# Patient Record
Sex: Female | Born: 1995 | Race: Black or African American | Hispanic: No | Marital: Single | Smoking: Former smoker
Health system: Southern US, Community
[De-identification: ages and names within clinical notes are randomized; demographics above are authoritative.]

## PROBLEM LIST (undated history)

## (undated) DIAGNOSIS — E611 Iron deficiency: Secondary | ICD-10-CM

---

## 2021-04-03 ENCOUNTER — Encounter (HOSPITAL_COMMUNITY): Payer: Self-pay | Admitting: Emergency Medicine

## 2021-04-03 ENCOUNTER — Emergency Department (HOSPITAL_COMMUNITY)
Admission: EM | Admit: 2021-04-03 | Discharge: 2021-04-03 | Disposition: A | Discharge status: Home / Self Care | Payer: Self-pay | Attending: Emergency Medicine | Admitting: Emergency Medicine

## 2021-04-03 ENCOUNTER — Emergency Department (HOSPITAL_COMMUNITY): Payer: Self-pay

## 2021-04-03 DIAGNOSIS — F17210 Nicotine dependence, cigarettes, uncomplicated: Secondary | ICD-10-CM | POA: Insufficient documentation

## 2021-04-03 DIAGNOSIS — Z23 Encounter for immunization: Secondary | ICD-10-CM | POA: Insufficient documentation

## 2021-04-03 DIAGNOSIS — S0990XA Unspecified injury of head, initial encounter: Secondary | ICD-10-CM

## 2021-04-03 DIAGNOSIS — W208XXA Other cause of strike by thrown, projected or falling object, initial encounter: Secondary | ICD-10-CM | POA: Insufficient documentation

## 2021-04-03 DIAGNOSIS — S0181XA Laceration without foreign body of other part of head, initial encounter: Secondary | ICD-10-CM | POA: Insufficient documentation

## 2021-04-03 MED ORDER — POTASSIUM CHLORIDE 10 MEQ/100ML IV SOLN: 10.0000 meq | Freq: Once | INTRAVENOUS | Status: DC

## 2021-04-03 MED ORDER — POTASSIUM CHLORIDE 10 MEQ/100ML IV SOLN: 10.0000 meq | INTRAVENOUS | Status: DC

## 2021-04-03 MED ORDER — POTASSIUM CHLORIDE CRYS ER 20 MEQ PO TBCR: 40.0000 meq | EXTENDED_RELEASE_TABLET | Freq: Once | ORAL | Status: DC

## 2021-04-03 MED ORDER — TETANUS-DIPHTH-ACELL PERTUSSIS 5-2.5-18.5 LF-MCG/0.5 IM SUSY
0.5000 mL | PREFILLED_SYRINGE | Freq: Once | INTRAMUSCULAR | Status: AC
  Administered 2021-04-03: 0.5 mL via INTRAMUSCULAR
  Filled 2021-04-03: qty 0.5

## 2021-04-03 MED ORDER — MAGNESIUM SULFATE 50 % IJ SOLN: 1.0000 g | Freq: Once | INTRAMUSCULAR | Status: DC

## 2021-04-03 MED ORDER — KETOROLAC TROMETHAMINE 30 MG/ML IJ SOLN
30.0000 mg | Freq: Once | INTRAMUSCULAR | Status: AC
  Administered 2021-04-03: 30 mg via INTRAMUSCULAR
  Filled 2021-04-03: qty 1

## 2021-04-03 MED ORDER — KETOROLAC TROMETHAMINE 30 MG/ML IJ SOLN: 30.0000 mg | Freq: Once | INTRAMUSCULAR | Status: DC
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
## 2021-04-03 NOTE — Discharge Instructions (Addendum)
You were seen today for head injury.  We scanned her head and there were no signs of a bleed or of glass inside the skull.  Keep the wound clean by using soap and water.  Avoid any hydrogen peroxide.  If you develop any severe headache, vomiting, loss of consciousness, or confusion please return back to the emergency department for further evaluation.  We also gave you an update your tetanus shots.  This should be good for the next 10 years.
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
## 2023-04-02 ENCOUNTER — Other Ambulatory Visit: Payer: Self-pay | Admitting: Obstetrics and Gynecology

## 2023-04-02 DIAGNOSIS — N6310 Unspecified lump in the right breast, unspecified quadrant: Secondary | ICD-10-CM

## 2024-10-22 ENCOUNTER — Ambulatory Visit
Admission: EM | Admit: 2024-10-22 | Discharge: 2024-10-22 | Disposition: A | Discharge status: Home / Self Care | Attending: Family Medicine | Admitting: Family Medicine

## 2024-10-22 ENCOUNTER — Encounter (HOSPITAL_COMMUNITY): Payer: Self-pay | Admitting: Emergency Medicine

## 2024-10-22 DIAGNOSIS — R070 Pain in throat: Secondary | ICD-10-CM | POA: Diagnosis present

## 2024-10-22 DIAGNOSIS — J988 Other specified respiratory disorders: Secondary | ICD-10-CM | POA: Diagnosis present

## 2024-10-22 DIAGNOSIS — B9789 Other viral agents as the cause of diseases classified elsewhere: Secondary | ICD-10-CM | POA: Diagnosis present

## 2024-10-22 HISTORY — DX: Iron deficiency: E61.1

## 2024-10-22 LAB — POCT RAPID STREP A (OFFICE): Rapid Strep A Screen: NEGATIVE

## 2024-10-22 MED ORDER — PSEUDOEPHEDRINE HCL 30 MG PO TABS: 30.0000 mg | ORAL_TABLET | Freq: Three times a day (TID) | ORAL | 0 refills | Status: AC | PRN

## 2024-10-22 MED ORDER — IBUPROFEN 600 MG PO TABS: 600.0000 mg | ORAL_TABLET | Freq: Four times a day (QID) | ORAL | 0 refills | Status: AC | PRN

## 2024-10-22 MED ORDER — PROMETHAZINE-DM 6.25-15 MG/5ML PO SYRP: 5.0000 mL | ORAL_SOLUTION | Freq: Three times a day (TID) | ORAL | 0 refills | Status: AC | PRN

## 2024-10-22 MED ORDER — CETIRIZINE HCL 10 MG PO TABS: 10.0000 mg | ORAL_TABLET | Freq: Every day | ORAL | 0 refills | Status: AC

## 2024-10-22 NOTE — ED Triage Notes (Signed)
 Pt c/o sore throat and possible fever day 3-taking mucinex-NAD-steady gait

## 2024-10-22 NOTE — Discharge Instructions (Signed)
 We will manage this as a viral respiratory infection/illness. For sore throat or cough try using a honey-based tea. Use 3 teaspoons of honey with juice squeezed from half lemon. Place shaved pieces of ginger into 1/2-1 cup of water and warm over stove top. Then mix the ingredients and repeat every 4 hours as needed. Please take ibuprofen  600mg  every 6 hours with food alternating with OR taken together with Tylenol 500mg -650mg  every 6 hours for throat pain, fevers, aches and pains. Hydrate very well with at least 2 liters of water. Eat light meals such as soups (chicken and noodles, vegetable, chicken and wild rice).  Do not eat foods that you are allergic to.  Taking an antihistamine like Zyrtec (10mg  daily) can help against postnasal drainage, sinus congestion which can cause sinus pain, sinus headaches, throat pain, painful swallowing, coughing.  You can take this together with pseudoephedrine (Sudafed) at a dose of 30 mg 3 times a day or twice daily as needed for the same kind of nasal drip, congestion.

## 2024-10-22 NOTE — ED Provider Notes (Signed)
 " [facility]  Note:  This document was prepared using Conservation officer, historic buildings and may include unintentional dictation errors.  MRN: [id number] DOB: December 03, 1995  Subjective:   Capitola Resper is a 29 y.o. female presenting for 3 day history of throat pain, painful swallowing, subjective fever, dry cough that can elicit chest pain. No runny nose, congestion, ear pain, shob, wheezing, body aches. Tried Mucinex. No asthma. No smoking of any kind including cigarettes, cigars, vaping, marijuana use.    No current outpatient medications  Allergies[1]  Past Medical History:  Diagnosis Date   Low iron    per pt     History reviewed. No pertinent surgical history.  No family history on file.  Social History   Occupational History   Not on file  Tobacco Use   Smoking status: Former    Types: Cigarettes   Smokeless tobacco: Never  Vaping Use   Vaping status: Never Used  Substance and Sexual Activity   Alcohol use: Not Currently   Drug use: Not Currently   Sexual activity: Yes    Birth control/protection: None     ROS   Objective:   Vitals: BP 119/81 (BP Location: Right Arm)   Pulse 62   Temp 98.6 F (37 C) (Oral)   Resp 16   LMP 10/08/2024   SpO2 98%   Physical Exam Constitutional:      General: She is not in acute distress.    Appearance: Normal appearance. She is well-developed. She is not ill-appearing, toxic-appearing or diaphoretic.  HENT:     Head: Normocephalic and atraumatic.     Right Ear: External ear normal.     Left Ear: External ear normal.     Nose: Nose normal.     Mouth/Throat:     Mouth: Mucous membranes are moist.     Pharynx: No pharyngeal swelling, oropharyngeal exudate, posterior oropharyngeal erythema or uvula swelling.     Tonsils: No tonsillar exudate or tonsillar abscesses. 0 on the right. 0 on the left.  Eyes:     General: No scleral icterus.       Right eye: No discharge.        Left eye: No  discharge.     Extraocular Movements: Extraocular movements intact.  Cardiovascular:     Rate and Rhythm: Normal rate and regular rhythm.     Heart sounds: Normal heart sounds. No murmur heard.    No friction rub. No gallop.  Pulmonary:     Effort: Pulmonary effort is normal. No respiratory distress.     Breath sounds: No stridor. No wheezing, rhonchi or rales.  Chest:     Chest wall: No tenderness.  Skin:    General: Skin is warm and dry.  Neurological:     General: No focal deficit present.     Mental Status: She is alert and oriented to person, place, and time.  Psychiatric:        Mood and Affect: Mood normal.        Behavior: Behavior normal.     Results for orders placed or performed during the hospital encounter of 10/22/24 (from the past 24 hours)  POCT rapid strep A     Status: None   Collection Time: 10/22/24  4:12 PM  Result Value Ref Range   Rapid Strep A Screen Negative Negative    Assessment and Plan :   PDMP not reviewed this encounter.  1. Viral respiratory infection  Deferred imaging given clear cardiopulmonary exam, hemodynamically stable vital signs. Strep culture pending. Suspect viral URI, viral syndrome. Physical exam findings reassuring and vital signs stable for discharge. Advised supportive care, offered symptomatic relief. Counseled patient on potential for adverse effects with medications prescribed/recommended today, ER and return-to-clinic precautions discussed, patient verbalized understanding.       [1]  Allergies Allergen Reactions   Azithromycin Swelling     Christopher Savannah, PA-C 10/22/24 1938  "

## 2024-10-25 LAB — CULTURE, GROUP A STREP (THRC)

## 2024-10-28 ENCOUNTER — Ambulatory Visit (HOSPITAL_COMMUNITY): Payer: Self-pay
